# Patient Record
Sex: Female | Born: 1940 | Race: Black or African American | Hispanic: No | Marital: Single
Health system: Southern US, Community
[De-identification: ages and names within clinical notes are randomized; demographics above are authoritative.]

## PROBLEM LIST (undated history)

## (undated) DIAGNOSIS — J45909 Unspecified asthma, uncomplicated: Secondary | ICD-10-CM

## (undated) DIAGNOSIS — I10 Essential (primary) hypertension: Secondary | ICD-10-CM

---

## 2013-12-01 ENCOUNTER — Encounter (HOSPITAL_BASED_OUTPATIENT_CLINIC_OR_DEPARTMENT_OTHER): Payer: Self-pay | Admitting: Emergency Medicine

## 2013-12-01 ENCOUNTER — Emergency Department (HOSPITAL_BASED_OUTPATIENT_CLINIC_OR_DEPARTMENT_OTHER): Payer: Medicare Other

## 2013-12-01 ENCOUNTER — Emergency Department (HOSPITAL_BASED_OUTPATIENT_CLINIC_OR_DEPARTMENT_OTHER)
Admission: EM | Admit: 2013-12-01 | Discharge: 2013-12-01 | Disposition: A | Discharge status: Home / Self Care | Payer: Medicare Other | Attending: Emergency Medicine | Admitting: Emergency Medicine

## 2013-12-01 DIAGNOSIS — J40 Bronchitis, not specified as acute or chronic: Secondary | ICD-10-CM | POA: Insufficient documentation

## 2013-12-01 DIAGNOSIS — J45909 Unspecified asthma, uncomplicated: Secondary | ICD-10-CM | POA: Insufficient documentation

## 2013-12-01 DIAGNOSIS — J029 Acute pharyngitis, unspecified: Secondary | ICD-10-CM | POA: Insufficient documentation

## 2013-12-01 DIAGNOSIS — I10 Essential (primary) hypertension: Secondary | ICD-10-CM | POA: Insufficient documentation

## 2013-12-01 HISTORY — DX: Essential (primary) hypertension: I10

## 2013-12-01 HISTORY — DX: Unspecified asthma, uncomplicated: J45.909

## 2013-12-01 MED ORDER — PREDNISONE 20 MG PO TABS: 40.0000 mg | ORAL_TABLET | Freq: Every day | ORAL | Status: AC

## 2013-12-01 NOTE — ED Notes (Signed)
Cough has been seen by pvt MD and is taking mucinex for same.  Color good  Skin warm and dry  Alert and oriented.  Denies other problems

## 2013-12-01 NOTE — ED Provider Notes (Signed)
CSN: [id number]     Arrival date & time 12/01/13  [id number] History   First MD Initiated Contact with Patient 12/01/13 [phone]     Chief Complaint  Patient presents with  . Cough     (Consider location/radiation/quality/duration/timing/severity/associated sxs/prior Treatment) Patient is a 73 y.o. female presenting with cough. The history is provided by the patient.  Cough Cough characteristics:  Productive Sputum characteristics:  Yellow Severity:  Moderate Onset quality:  Gradual Duration:  5 days Timing:  Intermittent Progression:  Unchanged Chronicity:  New Smoker: no   Context: upper respiratory infection   Relieved by:  Beta-agonist inhaler Worsened by:  Activity Associated symptoms: rhinorrhea, shortness of breath, sinus congestion, sore throat and wheezing   Associated symptoms: no chest pain, no chills and no fever   Associated symptoms comment:  Sore throat and congestion are resolved.  No just left with cough, wheezing and intermittent SOB Shortness of breath:    Severity:  Mild   Onset quality:  Gradual   Timing:  Intermittent   Progression:  Unchanged Risk factors comment:  Hx of asthma so has an inhaler she used 2 times yesterday with improvement   No past medical history on file. No past surgical history on file. No family history on file. History  Substance Use Topics  . Smoking status: Not on file  . Smokeless tobacco: Not on file  . Alcohol Use: Not on file   OB History   No data available     Review of Systems  Constitutional: Negative for fever and chills.  HENT: Positive for rhinorrhea and sore throat.   Respiratory: Positive for cough, shortness of breath and wheezing.   Cardiovascular: Negative for chest pain.  All other systems reviewed and are negative.      Allergies  Review of patient's allergies indicates not on file.  Home Medications  No current outpatient prescriptions on file. BP 131/116  Pulse 82  Temp(Src) 97.5 F (36.4 C)  (Oral)  Resp 18  Ht 5\' 2"  (1.575 m)  Wt 182 lb (82.555 kg)  BMI 33.28 kg/m2  SpO2 94% Physical Exam  Nursing note and vitals reviewed. Constitutional: She is oriented to person, place, and time. She appears well-developed and well-nourished. No distress.  HENT:  Head: Normocephalic and atraumatic.  Right Ear: Tympanic membrane and ear canal normal.  Left Ear: Tympanic membrane and ear canal normal.  Mouth/Throat: Oropharynx is clear and moist and mucous membranes are normal.  Eyes: EOM are normal. Pupils are equal, round, and reactive to light.  Cardiovascular: Normal rate, regular rhythm, normal heart sounds and intact distal pulses.  Exam reveals no friction rub.   No murmur heard. Pulmonary/Chest: Effort normal and breath sounds normal. Not tachypneic. She has no decreased breath sounds. She has no wheezes. She has no rales.  Abdominal: Soft. Bowel sounds are normal. She exhibits no distension. There is no tenderness. There is no rebound and no guarding.  Musculoskeletal: Normal range of motion. She exhibits no tenderness.  No edema  Neurological: She is alert and oriented to person, place, and time.  Left sided facial droop which is chronic per pt  Skin: Skin is warm and dry. No rash noted.  Psychiatric: She has a normal mood and affect. Her behavior is normal.    ED Course  Procedures (including critical care time) Labs Review Labs Reviewed - No data to display Imaging Review Dg Chest 2 View  12/01/2013   CLINICAL DATA:  sob, cough, wheeze  EXAM:  CHEST  2 VIEW  COMPARISON:  DG CHEST 2V dated 11/12/2013; DG CHEST 2V dated 01/07/2011  FINDINGS: Cardiac silhouette moderately enlarged. Aorta is tortuous and ectatic.  Stable volume loss and scarring within the right lung base. 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 No focal reason consolidation or focal infiltrates.  IMPRESSION: Chronic changes without acute cardiopulmonary disease.    Electronically Signed   By: Salome Holmes M.D.   On: 12/01/2013 09:51     EKG Interpretation None      MDM   Final diagnoses:  Bronchitis    Pt with symptoms consistent with viral bronchitis with cough and wheezing that started after a sore throat and congestion.  Well appearing here.  No signs of breathing difficulty and no current wheezing.  No signs of pharyngitis, otitis or abnormal abdominal findings.   CXR wnl and pt to return with any further problems.  Sating 94-95% on RA     Gwyneth Sprout, MD 12/01/13 1007

## 2013-12-01 NOTE — Discharge Instructions (Signed)
Bronchitis °Bronchitis is swelling (inflammation) of the air tubes leading to your lungs (bronchi). This causes mucus and a cough. If the swelling gets bad, you may have trouble breathing. °HOME CARE  °· Rest. °· Drink enough fluids to keep your pee (urine) clear or pale yellow (unless you have a condition where you have to watch how much you drink). °· Only take medicine as told by your doctor. If you were given antibiotic medicines, finish them even if you start to feel better. °· Avoid smoke, irritating chemicals, and strong smells. These make the problem worse. Quit smoking if you smoke. This helps your lungs heal faster. °· Use a cool mist humidifier. Change the water in the humidifier every day. You can also sit in the bathroom with hot shower running for 5 10 minutes. Keep the door closed. °· See your health care provider as told. °· Wash your hands often. °GET HELP IF: °Your problems do not get better after 1 week. °GET HELP RIGHT AWAY IF:  °· Your fever gets worse. °· You have chills. °· Your chest hurts. °· Your problems breathing get worse. °· You have blood in your mucus. °· You pass out (faint). °· You feel lightheaded. °· You have a bad headache. °· You throw up (vomit) again and again. °MAKE SURE YOU: °· Understand these instructions. °· Will watch your condition. °· Will get help right away if you are not doing well or get worse. °Document Released: 02/01/2008 Document Revised: 06/05/2013 Document Reviewed: 04/09/2013 °[employer]® Patient Information ©2014 [employer]. ° °

## 2014-09-25 IMAGING — CR DG CHEST 2V
2 series · 2 of 2 positions shown · non-contrast
Comparison: DG CHEST 2V dated 11/12/2013; DG CHEST 2V dated
01/07/2011

CLINICAL DATA: sob, cough, wheeze

EXAM:
CHEST  2 VIEW

[w chest pa]
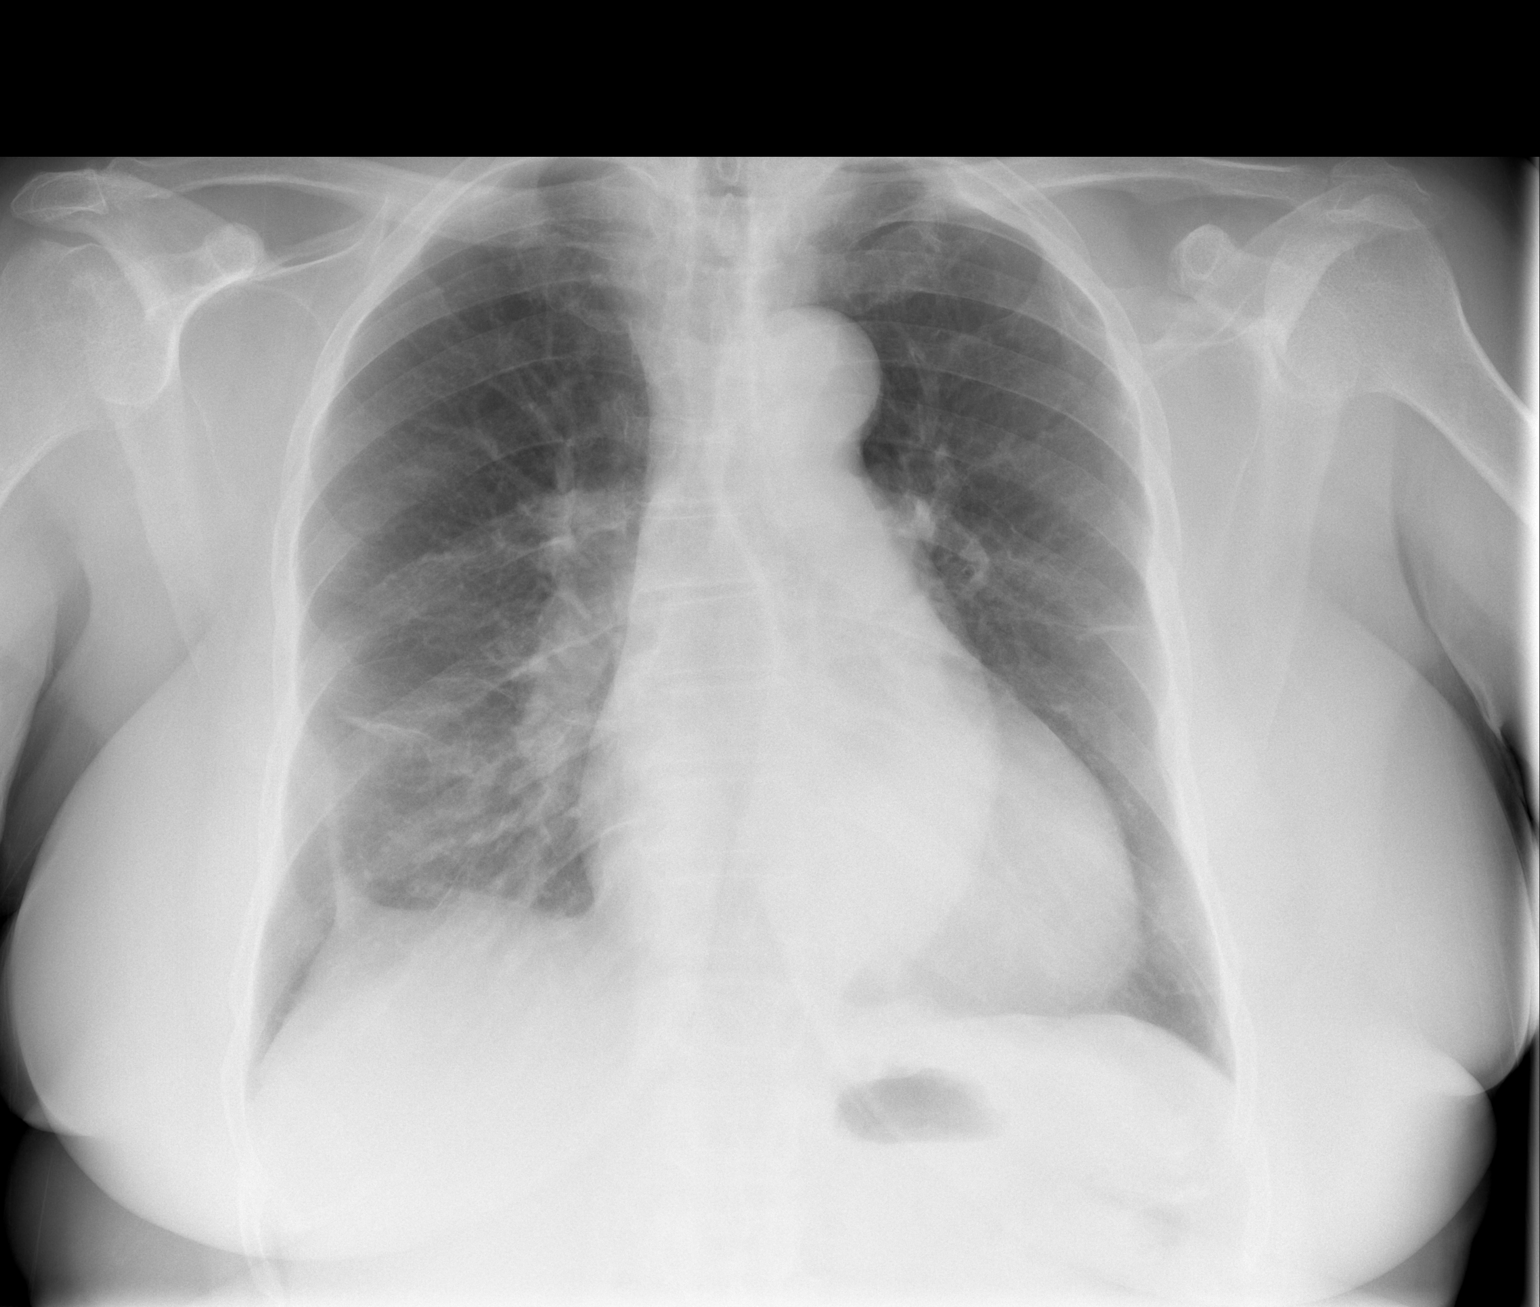

[w chest lat]
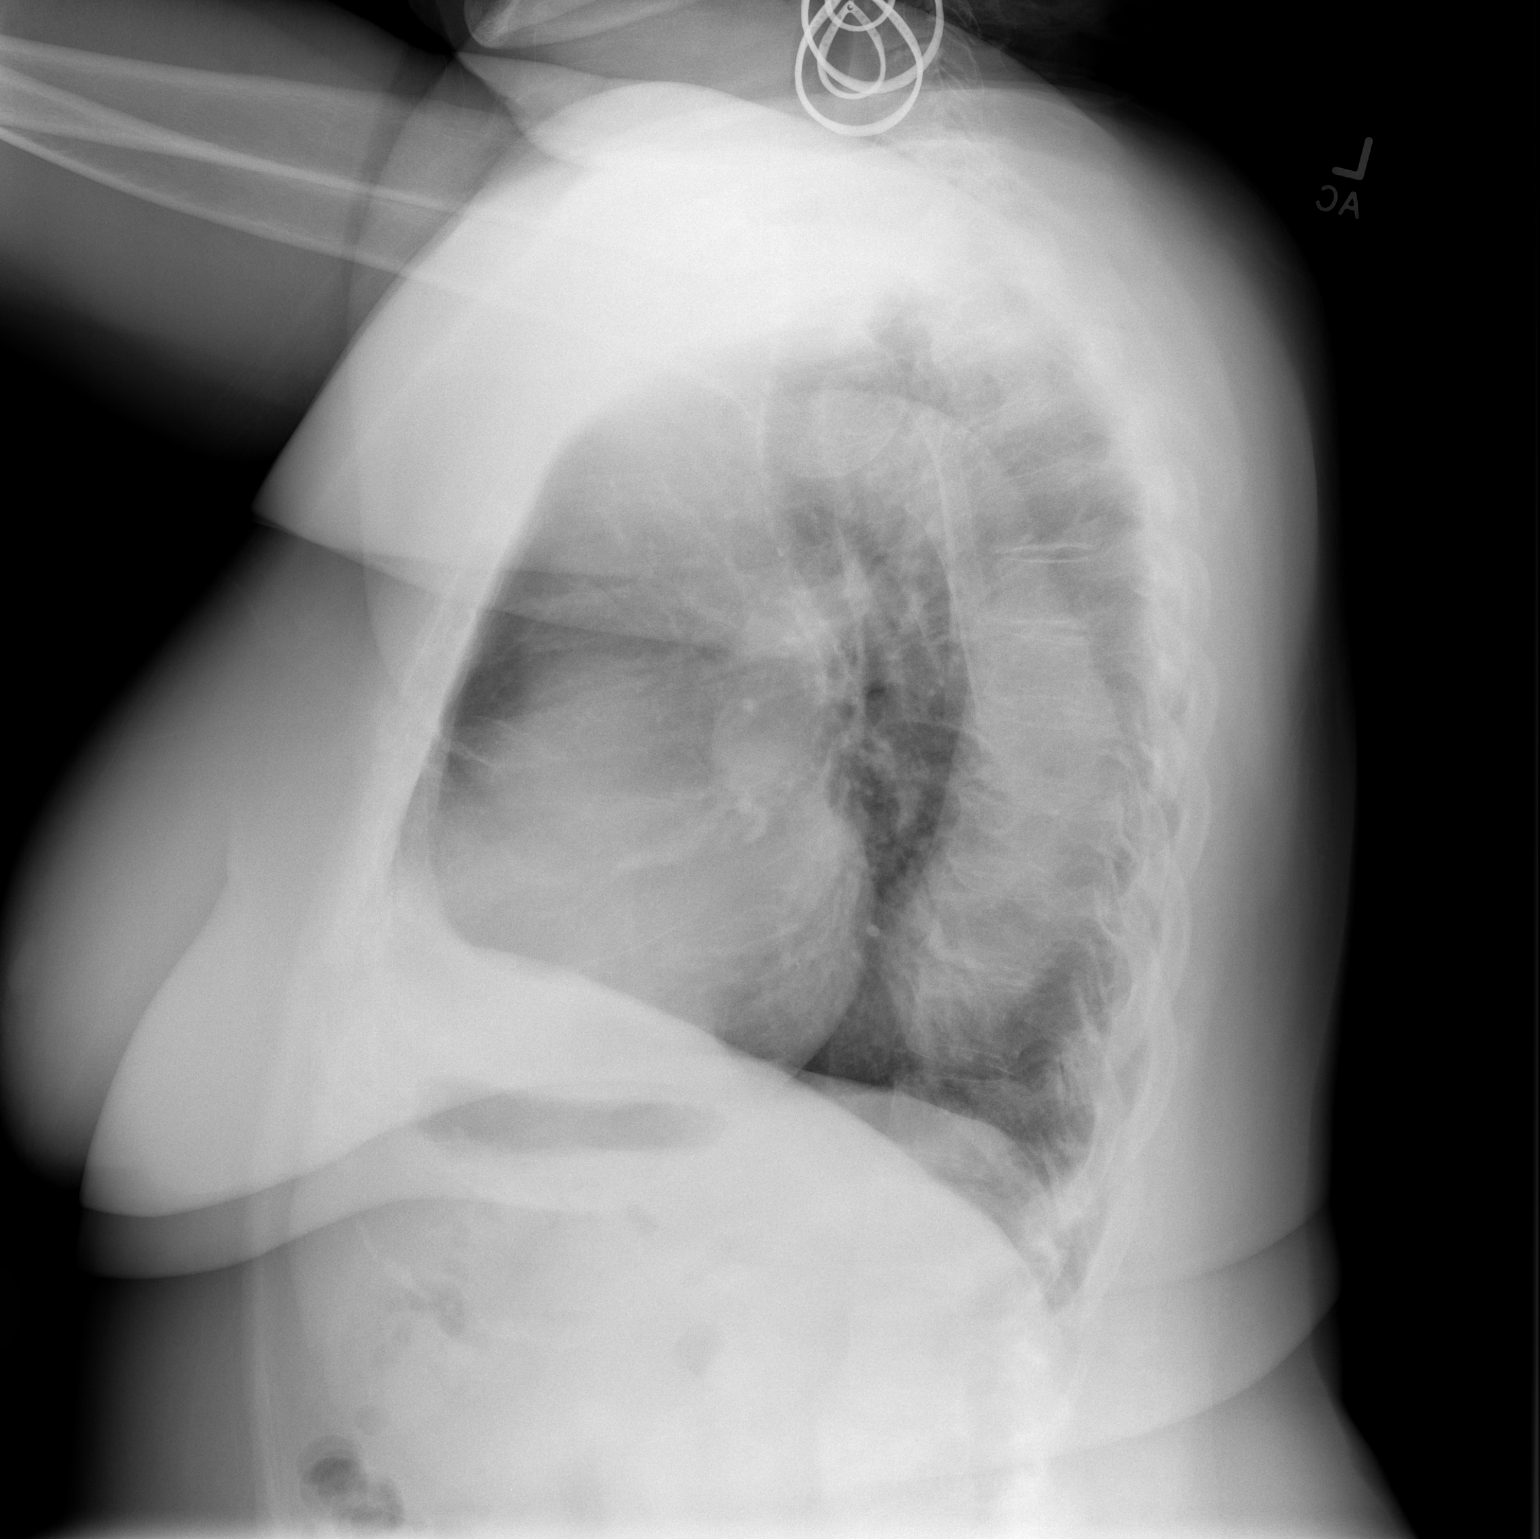

[2 of 2 positions shown; findings below may reference images not displayed]

FINDINGS: Cardiac silhouette moderately enlarged. Aorta is tortuous and
ectatic.

Stable volume loss and scarring within the right lung base. Stable
areas of discoid atelectasis right lung base and left mid hemi
thorax. No focal reason consolidation or focal infiltrates.
IMPRESSION: Chronic changes without acute cardiopulmonary disease.
